# Patient Record
Sex: Female | Born: 2000 | Race: White | Hispanic: No | Marital: Single | Smoking: Never smoker
Health system: Southern US, Community
[De-identification: ages and names within clinical notes are randomized; demographics above are authoritative.]

## PROBLEM LIST (undated history)

## (undated) DIAGNOSIS — J45909 Unspecified asthma, uncomplicated: Secondary | ICD-10-CM

---
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
## 2019-01-11 ENCOUNTER — Encounter: Payer: BLUE CROSS/BLUE SHIELD | Admitting: Family

## 2019-01-26 ENCOUNTER — Other Ambulatory Visit: Payer: Self-pay | Admitting: Pediatrics

## 2019-01-26 DIAGNOSIS — F411 Generalized anxiety disorder: Secondary | ICD-10-CM

## 2020-12-18 ENCOUNTER — Other Ambulatory Visit: Payer: Self-pay

## 2020-12-18 ENCOUNTER — Encounter: Payer: Self-pay | Admitting: Plastic Surgery

## 2020-12-18 ENCOUNTER — Ambulatory Visit (INDEPENDENT_AMBULATORY_CARE_PROVIDER_SITE_OTHER): Payer: BC Managed Care – PPO | Admitting: Plastic Surgery

## 2020-12-18 ENCOUNTER — Other Ambulatory Visit (HOSPITAL_COMMUNITY)
Admission: RE | Admit: 2020-12-18 | Discharge: 2020-12-18 | Disposition: A | Discharge status: Home / Self Care | Payer: BC Managed Care – PPO | Source: Ambulatory Visit | Attending: Plastic Surgery | Admitting: Plastic Surgery

## 2020-12-18 DIAGNOSIS — L819 Disorder of pigmentation, unspecified: Secondary | ICD-10-CM | POA: Diagnosis not present

## 2020-12-18 NOTE — Progress Notes (Signed)
.  [facility] Procedure Note  Preoperative Dx: Changing pigmented skin lesion back and left thigh  Postoperative Dx: Same  Procedure: Excision of changing pigmented skin lesion: Back 1 cm Left Thigh 6 mm  Anesthesia: Lidocaine 1% with 1:100,000 epinepherine  Indication for Procedure: Changing skin lesion  Description of Procedure: Risks and complications were explained to the patient.  Consent was confirmed and the patient understands the risks and benefits.  The potential complications and alternatives were explained and the patient consents.  The patient expressed understanding the option of not having the procedure and the risks of a scar.  Time out was called and all information was confirmed to be correct.    Back:  The area was prepped and drapped.  Lidocaine 1% with epinepherine was injected in the subcutaneous area.  After waiting several minutes for the local to take affect a #15 blade was used to excise the area in an eliptical pattern.  A 5-0 Monocryl was used to close the skin edges. Derma bond and a dressing was applied.    Left thigh:  The area was prepped and drapped.  Lidocaine 1% with epinepherine was injected in the subcutaneous area.  After waiting several minutes for the local to take affect a #15 blade was used to excise the area in an eliptical pattern.  A 5-0 Monocryl was used to close the skin edges.  A dressing was applied.  The patient was given instructions on how to care for the area and a follow up appointment.  Kathleen Mccarthy tolerated the procedure well and there were no complications. The specimens were sent to pathology.

## 2020-12-18 NOTE — Addendum Note (Signed)
Addended by: Peggye Form on: 12/18/2020 10:55 AM   Modules accepted: Orders

## 2020-12-20 LAB — SURGICAL PATHOLOGY

## 2020-12-21 ENCOUNTER — Telehealth: Payer: Self-pay | Admitting: *Deleted

## 2020-12-21 NOTE — Telephone Encounter (Signed)
Called and spoke with the patient and informed her that her surgical pathology was benign.  Patient verbalized understanding and agreed.//AB/CMA

## 2020-12-21 NOTE — Telephone Encounter (Signed)
Called and [facility] @ 12:01pm) asking the patient to return call regarding recent surgical pathology.//AB/CMA

## 2020-12-21 NOTE — Telephone Encounter (Signed)
-----   Message from Peggye Form, DO sent at 12/21/2020  9:29 AM EDT ----- Please let patient know it was benign ----- Message ----- From: Interface, Lab In Three Zero One Sent: 12/20/2020   4:35 PM EDT To: Alena Bills Dillingham, DO

## 2021-01-01 ENCOUNTER — Telehealth (INDEPENDENT_AMBULATORY_CARE_PROVIDER_SITE_OTHER): Payer: BC Managed Care – PPO | Admitting: Plastic Surgery

## 2021-01-01 ENCOUNTER — Encounter: Payer: Self-pay | Admitting: Plastic Surgery

## 2021-01-01 ENCOUNTER — Other Ambulatory Visit: Payer: Self-pay

## 2021-01-01 DIAGNOSIS — L819 Disorder of pigmentation, unspecified: Secondary | ICD-10-CM

## 2021-01-01 NOTE — Progress Notes (Signed)
I connected with  Kathleen Mccarthy on 01/01/21 by a video enabled telemedicine application and verified that I am speaking with the correct person using two identifiers.  She was at her apartment and I was at the office.   I discussed the limitations of evaluation and management by telemedicine. The patient expressed understanding and agreed to proceed.  She had excision of two skin lesions.  Both lesions were compound nevus I and benign.  They are healing well.  She is going to have her nursing roommate cut the stitch next week.  She knows to give [place] call if she has any questions or concerns.

## 2021-02-26 ENCOUNTER — Institutional Professional Consult (permissible substitution): Payer: Self-pay | Admitting: Plastic Surgery
